# Patient Record
Sex: Male | Born: 1995 | Race: White | Hispanic: No | Marital: Single
Health system: Southern US, Community
[De-identification: ages and names within clinical notes are randomized; demographics above are authoritative.]

---
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
## 2020-04-22 ENCOUNTER — Telehealth: Payer: Self-pay

## 2020-04-22 NOTE — Telephone Encounter (Signed)
Phone call to patient to let him know that we need his updated insurance information as well as his income information for [employer] patient assistance for his [employer]. Voicemail left for patient to give the office a call back.

## 2020-04-22 NOTE — Telephone Encounter (Signed)
Fax from [employer] stating the patient doesn't have an active insurance policy and the patient may be eligible for assistance from the [employer],

## 2020-04-22 NOTE — Telephone Encounter (Signed)
Fax received from [employer] stating that they need the patient's updated insurance information as well as the patient's income information before they can complete the enrollment application for patient assistance.

## 2020-04-27 NOTE — Telephone Encounter (Signed)
Phone call to patient to get updated insurance information.  Voicemail left for patient to give the office a call back.

## 2020-05-12 NOTE — Telephone Encounter (Signed)
After several attempts to contact patient he hasn't returned the offices calls.

## 2020-09-14 ENCOUNTER — Other Ambulatory Visit: Payer: Self-pay | Admitting: Dermatology

## 2020-09-14 NOTE — Telephone Encounter (Addendum)
Mom called.He was on [employer], and did very well on it. He's now in [place] and wants to get back on it via "covered till you're covered" and they him to contact us to get him back on it. Per SS, told her that he needs tb test  + an office visit here or he needs to find a new dermatologist in [place].

## 2020-09-14 NOTE — Telephone Encounter (Signed)
Left message for mom stating that we have to have office visit and TB to restart [employer] or he will have to find dermatologist in [place].

## 2022-07-20 ENCOUNTER — Ambulatory Visit: Payer: Self-pay | Admitting: Dermatology
